# Patient Record
Sex: Female | Born: 1965 | Hispanic: Yes | Marital: Married | Smoking: Never smoker
Health system: Southern US, Community
[De-identification: ages and names within clinical notes are randomized; demographics above are authoritative.]

## PROBLEM LIST (undated history)

## (undated) DIAGNOSIS — E119 Type 2 diabetes mellitus without complications: Secondary | ICD-10-CM

## (undated) DIAGNOSIS — E7800 Pure hypercholesterolemia, unspecified: Secondary | ICD-10-CM

---

## 2006-06-11 ENCOUNTER — Ambulatory Visit: Payer: Self-pay | Admitting: Family Medicine

## 2007-03-12 ENCOUNTER — Emergency Department: Payer: Self-pay | Admitting: Internal Medicine

## 2010-06-24 ENCOUNTER — Ambulatory Visit: Payer: Self-pay | Admitting: Family

## 2012-09-15 ENCOUNTER — Ambulatory Visit: Payer: Self-pay | Admitting: Internal Medicine

## 2015-05-09 ENCOUNTER — Other Ambulatory Visit: Payer: Self-pay | Admitting: Internal Medicine

## 2015-05-09 DIAGNOSIS — Z1231 Encounter for screening mammogram for malignant neoplasm of breast: Secondary | ICD-10-CM

## 2015-05-16 ENCOUNTER — Ambulatory Visit: Payer: Self-pay | Attending: Internal Medicine

## 2016-06-09 ENCOUNTER — Ambulatory Visit: Payer: Self-pay | Attending: Internal Medicine

## 2016-11-17 ENCOUNTER — Other Ambulatory Visit: Payer: Self-pay | Admitting: Internal Medicine

## 2016-11-17 DIAGNOSIS — R1011 Right upper quadrant pain: Secondary | ICD-10-CM

## 2016-11-24 ENCOUNTER — Ambulatory Visit
Admission: RE | Admit: 2016-11-24 | Discharge: 2016-11-24 | Disposition: A | Discharge status: Home / Self Care | Payer: BLUE CROSS/BLUE SHIELD | Source: Ambulatory Visit | Attending: Internal Medicine | Admitting: Internal Medicine

## 2016-11-24 DIAGNOSIS — R1011 Right upper quadrant pain: Secondary | ICD-10-CM | POA: Diagnosis not present

## 2016-11-24 DIAGNOSIS — R935 Abnormal findings on diagnostic imaging of other abdominal regions, including retroperitoneum: Secondary | ICD-10-CM | POA: Diagnosis not present

## 2017-06-14 ENCOUNTER — Other Ambulatory Visit: Payer: Self-pay | Admitting: Internal Medicine

## 2017-06-14 DIAGNOSIS — Z1231 Encounter for screening mammogram for malignant neoplasm of breast: Secondary | ICD-10-CM

## 2017-06-28 ENCOUNTER — Encounter: Payer: Self-pay | Admitting: Radiology

## 2017-06-28 ENCOUNTER — Ambulatory Visit
Admission: RE | Admit: 2017-06-28 | Discharge: 2017-06-28 | Disposition: A | Discharge status: Home / Self Care | Payer: Managed Care, Other (non HMO) | Source: Ambulatory Visit | Attending: Internal Medicine | Admitting: Internal Medicine

## 2017-06-28 DIAGNOSIS — Z1231 Encounter for screening mammogram for malignant neoplasm of breast: Secondary | ICD-10-CM

## 2018-04-01 ENCOUNTER — Encounter: Payer: Self-pay | Admitting: Internal Medicine

## 2018-04-19 ENCOUNTER — Encounter: Payer: Self-pay | Admitting: *Deleted

## 2018-10-05 ENCOUNTER — Other Ambulatory Visit: Payer: Self-pay | Admitting: Nurse Practitioner

## 2018-10-05 DIAGNOSIS — Z1231 Encounter for screening mammogram for malignant neoplasm of breast: Secondary | ICD-10-CM

## 2019-03-11 ENCOUNTER — Other Ambulatory Visit: Payer: Self-pay | Admitting: Family Medicine

## 2019-03-11 DIAGNOSIS — Z20822 Contact with and (suspected) exposure to covid-19: Secondary | ICD-10-CM

## 2019-03-15 LAB — NOVEL CORONAVIRUS, NAA: SARS-CoV-2, NAA: NOT DETECTED

## 2020-09-24 ENCOUNTER — Other Ambulatory Visit: Payer: Self-pay

## 2020-09-24 ENCOUNTER — Other Ambulatory Visit: Payer: Managed Care, Other (non HMO)

## 2020-09-24 DIAGNOSIS — Z20822 Contact with and (suspected) exposure to covid-19: Secondary | ICD-10-CM

## 2020-09-27 LAB — NOVEL CORONAVIRUS, NAA: SARS-CoV-2, NAA: DETECTED — AB

## 2020-12-20 ENCOUNTER — Other Ambulatory Visit: Payer: Self-pay | Admitting: Internal Medicine

## 2020-12-20 DIAGNOSIS — Z1231 Encounter for screening mammogram for malignant neoplasm of breast: Secondary | ICD-10-CM

## 2021-09-03 ENCOUNTER — Other Ambulatory Visit: Payer: Self-pay | Admitting: Nurse Practitioner

## 2021-09-03 DIAGNOSIS — Z1231 Encounter for screening mammogram for malignant neoplasm of breast: Secondary | ICD-10-CM

## 2021-09-16 ENCOUNTER — Other Ambulatory Visit: Payer: Self-pay

## 2021-09-16 ENCOUNTER — Ambulatory Visit
Admission: RE | Admit: 2021-09-16 | Discharge: 2021-09-16 | Disposition: A | Discharge status: Home / Self Care | Payer: BC Managed Care – PPO | Source: Ambulatory Visit | Attending: Nurse Practitioner | Admitting: Nurse Practitioner

## 2021-09-16 DIAGNOSIS — Z1231 Encounter for screening mammogram for malignant neoplasm of breast: Secondary | ICD-10-CM | POA: Insufficient documentation

## 2022-08-25 ENCOUNTER — Other Ambulatory Visit: Payer: Self-pay | Admitting: Family Medicine

## 2022-08-25 DIAGNOSIS — Z1231 Encounter for screening mammogram for malignant neoplasm of breast: Secondary | ICD-10-CM

## 2022-10-03 ENCOUNTER — Ambulatory Visit
Admission: EM | Admit: 2022-10-03 | Discharge: 2022-10-03 | Disposition: A | Discharge status: Home / Self Care | Payer: BC Managed Care – PPO | Attending: Urgent Care | Admitting: Urgent Care

## 2022-10-03 DIAGNOSIS — I950 Idiopathic hypotension: Secondary | ICD-10-CM | POA: Diagnosis not present

## 2022-10-03 DIAGNOSIS — Z1152 Encounter for screening for COVID-19: Secondary | ICD-10-CM | POA: Insufficient documentation

## 2022-10-03 DIAGNOSIS — N3001 Acute cystitis with hematuria: Secondary | ICD-10-CM | POA: Diagnosis not present

## 2022-10-03 DIAGNOSIS — R1115 Cyclical vomiting syndrome unrelated to migraine: Secondary | ICD-10-CM | POA: Insufficient documentation

## 2022-10-03 DIAGNOSIS — R531 Weakness: Secondary | ICD-10-CM | POA: Diagnosis not present

## 2022-10-03 DIAGNOSIS — R6889 Other general symptoms and signs: Secondary | ICD-10-CM | POA: Diagnosis present

## 2022-10-03 DIAGNOSIS — W19XXXA Unspecified fall, initial encounter: Secondary | ICD-10-CM | POA: Diagnosis not present

## 2022-10-03 DIAGNOSIS — R058 Other specified cough: Secondary | ICD-10-CM | POA: Diagnosis not present

## 2022-10-03 LAB — POCT URINALYSIS DIP (MANUAL ENTRY)
Bilirubin, UA: NEGATIVE
Glucose, UA: NEGATIVE mg/dL
Ketones, POC UA: NEGATIVE mg/dL
Nitrite, UA: POSITIVE — AB
Protein Ur, POC: 100 mg/dL — AB
Spec Grav, UA: 1.025 (ref 1.010–1.025)
Urobilinogen, UA: 0.2 E.U./dL
pH, UA: 6 (ref 5.0–8.0)

## 2022-10-03 LAB — POCT FASTING CBG KUC MANUAL ENTRY: POCT Glucose (KUC): 270 mg/dL — AB (ref 70–99)

## 2022-10-03 MED ORDER — OSELTAMIVIR PHOSPHATE 75 MG PO CAPS: 75.0000 mg | ORAL_CAPSULE | Freq: Two times a day (BID) | ORAL | 0 refills | Status: DC

## 2022-10-03 MED ORDER — ONDANSETRON 4 MG PO TBDP: 4.0000 mg | ORAL_TABLET | Freq: Three times a day (TID) | ORAL | 0 refills | Status: DC | PRN

## 2022-10-03 MED ORDER — ONDANSETRON 4 MG PO TBDP
4.0000 mg | ORAL_TABLET | Freq: Once | ORAL | Status: AC
  Administered 2022-10-03: 4 mg via ORAL

## 2022-10-03 MED ORDER — SODIUM CHLORIDE 0.9 % IV BOLUS
1000.0000 mL | Freq: Once | INTRAVENOUS | Status: AC
  Administered 2022-10-03: 1000 mL via INTRAVENOUS

## 2022-10-03 MED ORDER — NITROFURANTOIN MONOHYD MACRO 100 MG PO CAPS: 100.0000 mg | ORAL_CAPSULE | Freq: Two times a day (BID) | ORAL | 0 refills | Status: DC

## 2022-10-03 NOTE — Discharge Instructions (Addendum)
You have been diagnosed with a viral upper respiratory infection based on your symptoms and exam. Viral illnesses cannot be treated with antibiotics - they are self limiting - and you should find your symptoms resolving within a few days. Get plenty of rest and non-caffeinated fluids. Watch for signs of dehydration including reduced urine output and dark colored urine.  We have performed a respiratory swab testing for COVID. I have prescribed Tamiflu, antiviral therapy for influenza A, based on a presumptive diagnosis of influenza.  Someone will contact you after results of your swab are available with instructions to continue or stop this medication.   We recommend you use over-the-counter medications for symptom control including acetaminophen (Tylenol), ibuprofen (Advil/Motrin) or naproxen (Aleve) for throat pain, fever, chills or body aches. You may combine use of acetaminophen and ibuprofen/naproxen if needed.  Some patients find an pain-relieving throat spray such as Chloraseptic to be effective.  Also recommend cold/cough medication containing a cough suppressant such as dextromethorphan, as needed. Please note that some cough medications are not recommended if you suffer from hypertension.    Saline mist spray is helpful for removing excess mucus from your nose.  Room humidifiers are helpful to ease breathing at night. I recommend guaifenesin (Mucinex) with plenty of water throughout the day to help thin and loosen mucus secretions in your respiratory passages.   If appropriate based upon your other medical problems, you might also find relief of nasal/sinus congestion symptoms by using a nasal decongestant such as fluticasone (Flonase ) or pseudoephedrine (Sudafed sinus).  You will need to obtain Sudafed from behind the pharmacist counter.  Speak to the pharmacist to verify that you are not duplicating medications with other over-the-counter formulations that you may be using.

## 2022-10-03 NOTE — ED Triage Notes (Signed)
Pt. Presents to UC w/ c/o emesis that started this morning after taking her Rybelsus medication. Pt's children also states the patient has been weak and she was found down after a fall and possibly hit the back of her head.

## 2022-10-03 NOTE — ED Provider Notes (Signed)
Roderic Palau    CSN: [id number] Arrival date & time: 10/03/22  0841      History   Chief Complaint Chief Complaint  Patient presents with   Emesis    HPI Renessa Lahti is a 57 y.o. female.    Emesis   Patient presents to urgent care with complaint of multiple episodes of vomiting starting this morning after taking Rybelsus.  Patient is here with her children, son, who states the patient has been "weak" and she was found down on the floor after a fall.  They are concerned she may have hit the back of her head. She denies bleeding but has a tender spot.  She endorses fever, chills, body aches, productive cough starting Thursday night.  Blood pressure of 93/59 measured in clinic with elevated heart rate of 136 bpm.  History reviewed. No pertinent past medical history.  There are no problems to display for this patient.   History reviewed. No pertinent surgical history.  OB History   No obstetric history on file.      Home Medications    Prior to Admission medications   Not on File    Family History History reviewed. No pertinent family history.  Social History Social History   Tobacco Use   Smoking status: Unknown     Allergies   Patient has no known allergies.   Review of Systems Review of Systems  Gastrointestinal:  Positive for vomiting.     Physical Exam Triage Vital Signs ED Triage Vitals  Enc Vitals Group     BP 10/03/22 0902 (!) 93/59     Pulse Rate 10/03/22 0902 (!) 136     Resp 10/03/22 0902 17     Temp 10/03/22 0902 (!) 96.6 F (35.9 C)     Temp src --      SpO2 10/03/22 0902 95 %     Weight --      Height --      Head Circumference --      Peak Flow --      Pain Score 10/03/22 0905 0     Pain Loc --      Pain Edu? --      Excl. in [place]? --    No data found.  Updated Vital Signs BP (!) 93/59   Pulse (!) 136   Temp (!) 96.6 F (35.9 C)   Resp 17   SpO2 95%   Visual Acuity Right Eye Distance:   Left  Eye Distance:   Bilateral Distance:    Right Eye Near:   Left Eye Near:    Bilateral Near:     Physical Exam Vitals reviewed.  Constitutional:      Appearance: She is ill-appearing and diaphoretic.  Cardiovascular:     Rate and Rhythm: Regular rhythm. Tachycardia present.     Heart sounds: Normal heart sounds.  Pulmonary:     Effort: Pulmonary effort is normal.     Breath sounds: Normal breath sounds.  Skin:    General: Skin is warm.  Neurological:     General: No focal deficit present.     Mental Status: She is alert and oriented to person, place, and time.  Psychiatric:        Mood and Affect: Mood normal.        Behavior: Behavior normal.      UC Treatments / Results  Labs (all labs ordered are listed, but only abnormal results are displayed) Labs Reviewed  POCT  FASTING CBG [facility] MANUAL ENTRY - Abnormal; Notable for the following components:      Result Value   POCT Glucose ([facility]) 270 (*)    All other components within normal limits  POCT URINALYSIS DIP (MANUAL ENTRY)    EKG   Radiology No results found.  Procedures Procedures (including critical care time)  Medications Ordered in UC Medications - No data to display  Initial Impression / Assessment and Plan / UC Course  I have reviewed the triage vital signs and the nursing notes.  Pertinent labs & imaging results that were available during my care of the patient were reviewed by me and considered in my medical decision making (see chart for details).   IV inserted and administering 1 L normal saline.  Ondansetron 4 mg ODT administered for nausea.  She endorses improved symptoms after ondansetron.  Patient is afebrile here without recent antipyretics. Satting well on room air. Overall is ill appearing, diaphoretic, without respiratory distress. Pulmonary exam is unremarkable.  Lungs CTAB without wheezing, rhonchi, rales.  UA is positive for signs of UTI including small 1+ leukocytes, nitrite positive,  moderate blood.  Urine is cloudy.  Will treat with Macrobid.  Patient's symptoms are consistent with an acute viral process including influenza and COVID.  COVID swab was obtained however treating empirically for influenza given she is within the treatment window for antiviral therapy.  Will switch to Paxlovid if COVID-positive.  Will also discharge with ondansetron ODT since it was effective in clinic.  Patient discharged after completion of normal saline infusion with improved blood pressure and heart rate.  Final Clinical Impressions(s) / UC Diagnoses   Final diagnoses:  None   Discharge Instructions   None    ED Prescriptions   None    PDMP not reviewed this encounter.   Rose Phi, [place] 10/03/22 1011

## 2022-10-04 LAB — SARS CORONAVIRUS 2 (TAT 6-24 HRS): SARS Coronavirus 2: NEGATIVE

## 2022-10-11 IMAGING — MG MM DIGITAL SCREENING BILAT W/ TOMO AND CAD
6 of 10 series · 6 of 30 positions shown · non-contrast
Comparison: Previous exam(s).

CLINICAL DATA: Screening.

EXAM:
DIGITAL SCREENING BILATERAL MAMMOGRAM WITH TOMOSYNTHESIS AND CAD
TECHNIQUE: Bilateral screening digital craniocaudal and mediolateral oblique
mammograms were obtained. Bilateral screening digital breast
tomosynthesis was performed. The images were evaluated with
computer-aided detection.

[L CC synth-2D (1 of 2)]
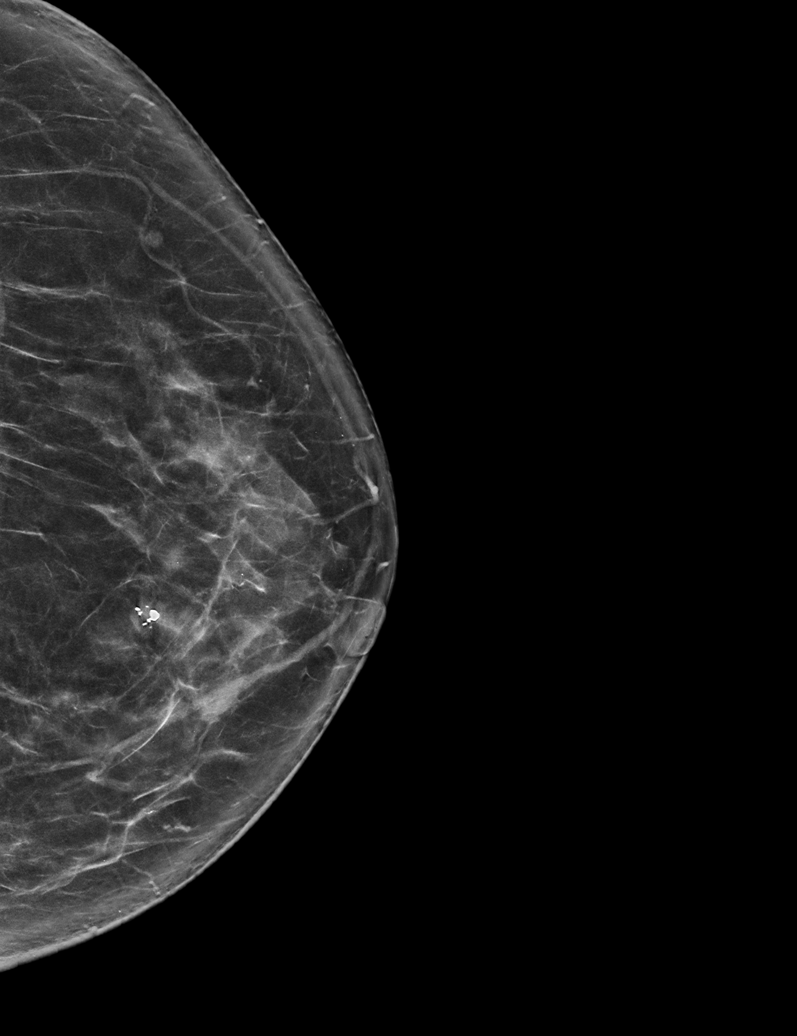

[L CC synth-2D (2 of 2)]
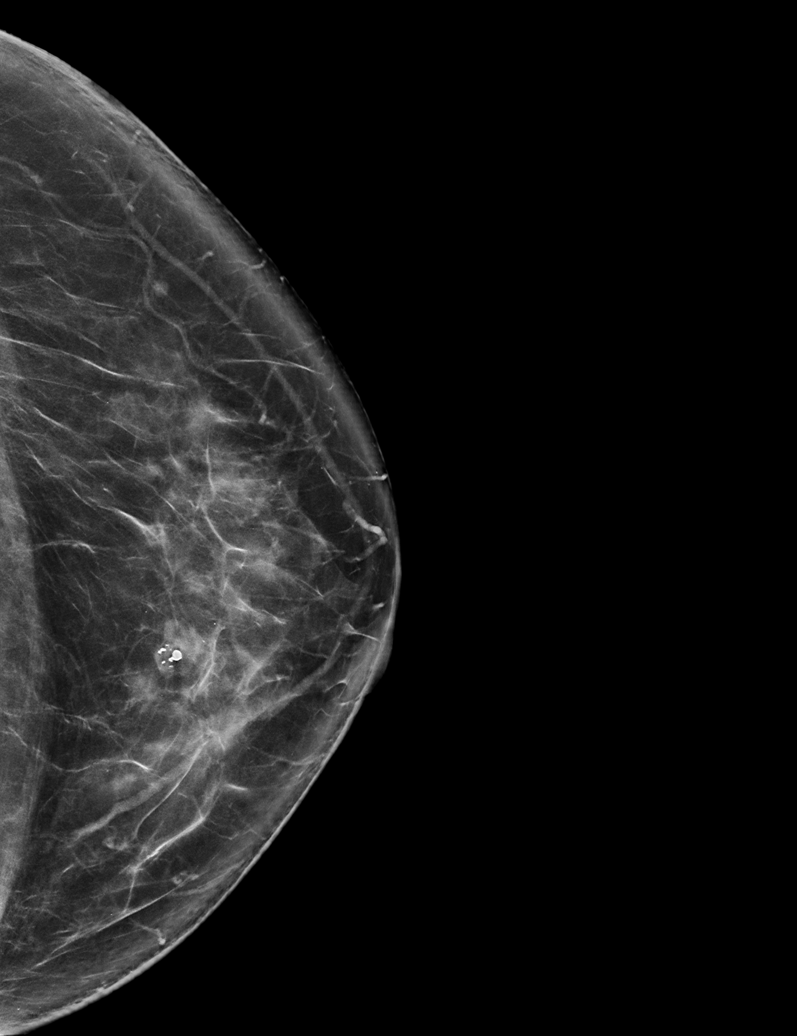

[L MLO synth-2D]
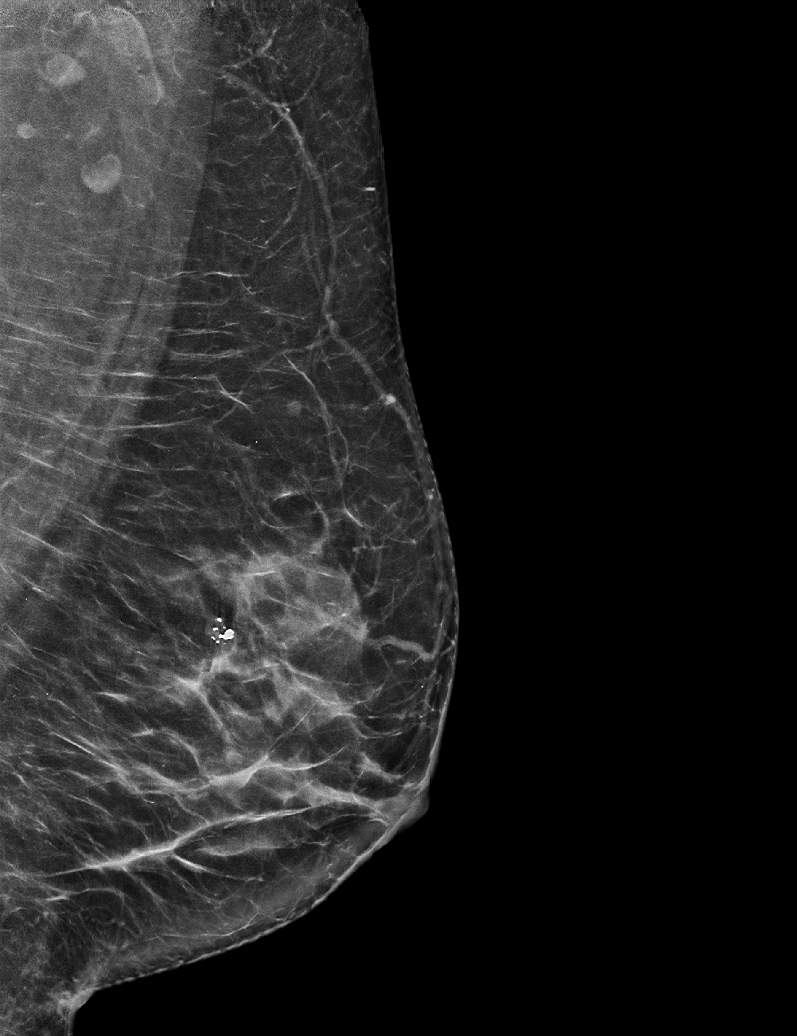

[R CC synth-2D]
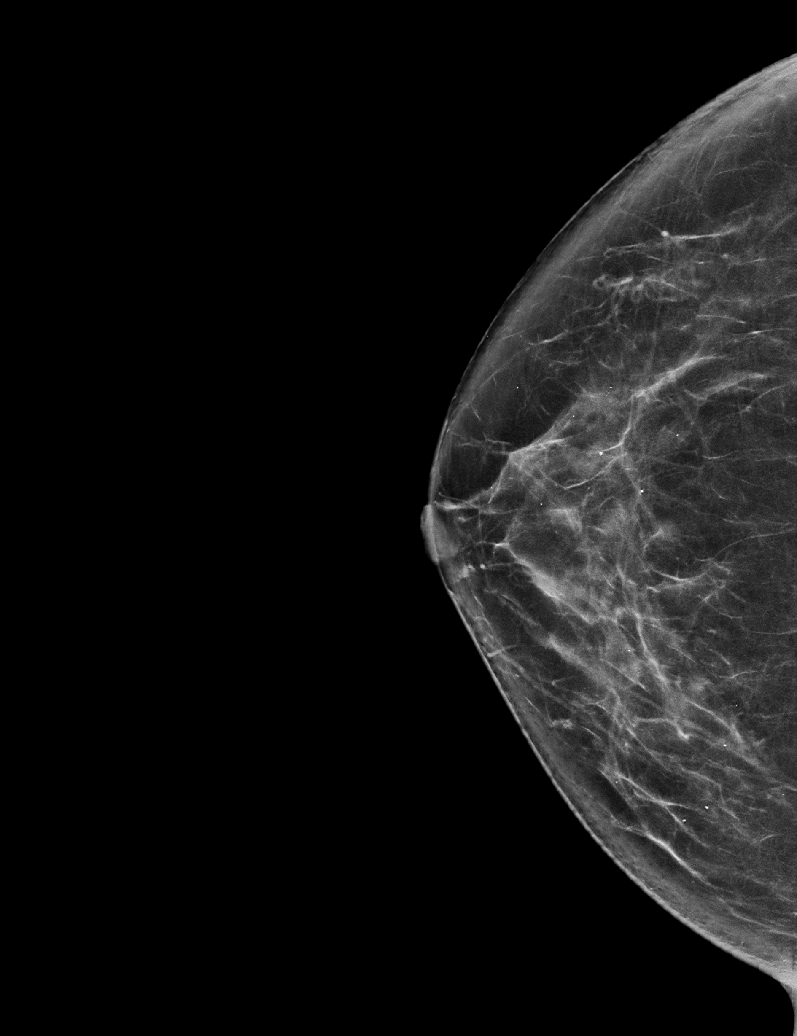

[R MLO synth-2D]
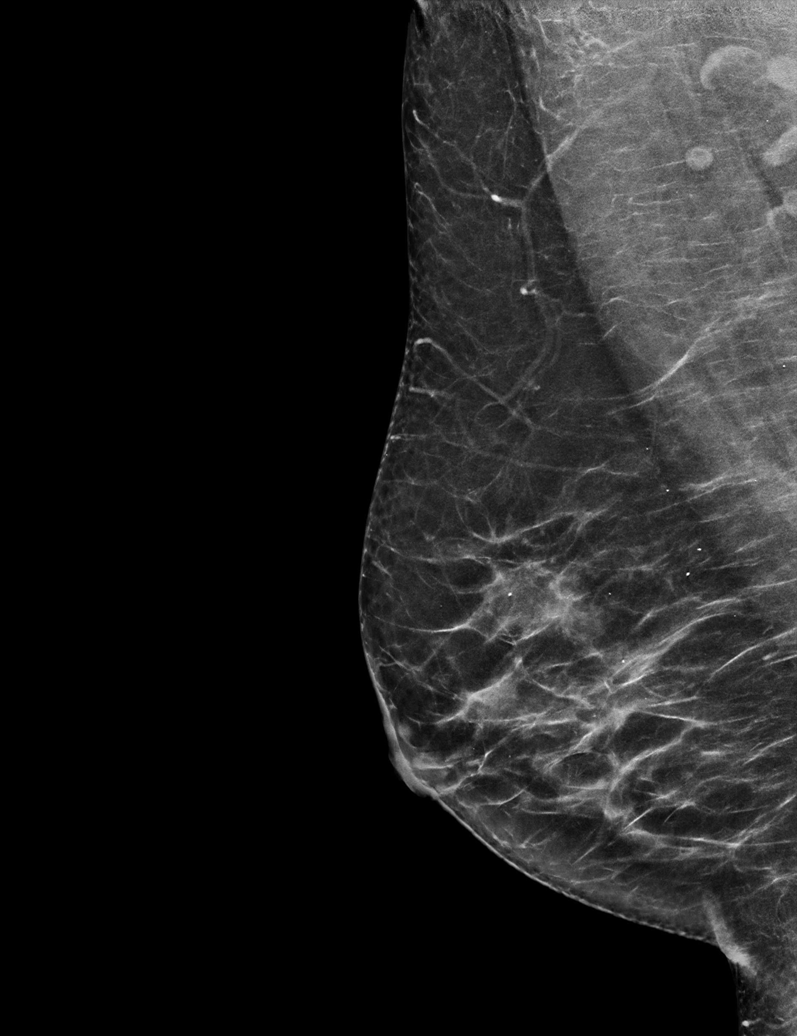

[R MLO tomo · tomo slice 33/64.0]
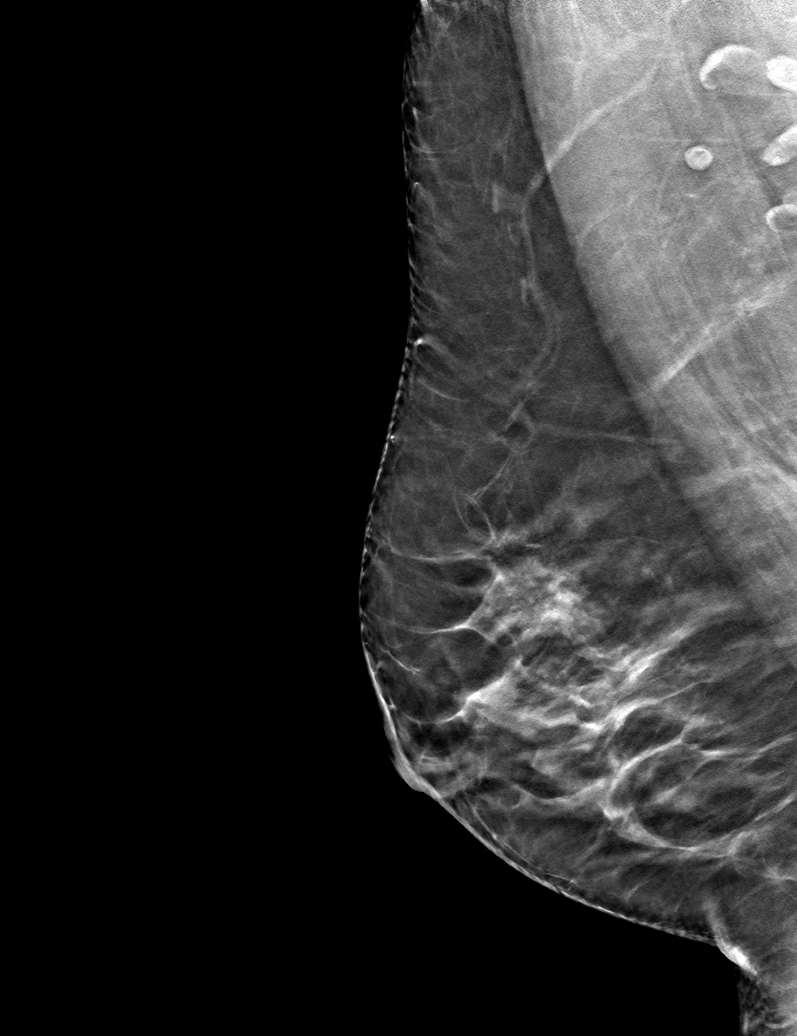

[6 of 30 positions shown; findings below may reference images not displayed]

ACR Breast Density Category b: There are scattered areas of
fibroglandular density.
FINDINGS: There are no findings suspicious for malignancy.
IMPRESSION: No mammographic evidence of malignancy. A result letter of this
screening mammogram will be mailed directly to the patient.

RECOMMENDATION:
Screening mammogram in one year. (Code:51-O-LD2)

BI-RADS CATEGORY  1: Negative.

## 2022-11-23 ENCOUNTER — Encounter: Payer: Self-pay | Admitting: Family Medicine

## 2022-11-30 ENCOUNTER — Encounter: Payer: Self-pay | Admitting: *Deleted

## 2023-06-09 ENCOUNTER — Telehealth: Payer: Self-pay

## 2023-06-09 NOTE — Telephone Encounter (Signed)
Patient called in to schedule colonoscopy.

## 2023-06-10 ENCOUNTER — Other Ambulatory Visit: Payer: Self-pay | Admitting: *Deleted

## 2023-06-10 ENCOUNTER — Telehealth: Payer: Self-pay | Admitting: *Deleted

## 2023-06-10 DIAGNOSIS — Z1211 Encounter for screening for malignant neoplasm of colon: Secondary | ICD-10-CM

## 2023-06-10 MED ORDER — PEG 3350-KCL-NABCB-NACL-NASULF 236 G PO SOLR: 4000.0000 mL | Freq: Once | ORAL | 0 refills | Status: AC

## 2023-06-10 NOTE — Telephone Encounter (Signed)
Colonoscopy schedule on 07/19/2023

## 2023-06-10 NOTE — Telephone Encounter (Signed)
Patient called back to schedule her colonoscopy

## 2023-06-10 NOTE — Telephone Encounter (Signed)
Message left for patient to return my call this morning because the connected was bad. I have left another message this afternoon.

## 2023-06-10 NOTE — Telephone Encounter (Signed)
Gastroenterology Pre-Procedure Review  Request Date: 07/19/2023 Requesting Physician: Dr. Tobi Bastos  PATIENT REVIEW QUESTIONS: The patient responded to the following health history questions as indicated:    1. Are you having any GI issues? no 2. Do you have a personal history of Polyps? no 3. Do you have a family history of Colon Cancer or Polyps? no 4. Diabetes Mellitus? yes (taking Janumet and Rybelsus) 5. Joint replacements in the past 12 months?no 6. Major health problems in the past 3 months?no 7. Any artificial heart valves, MVP, or defibrillator?no    MEDICATIONS & ALLERGIES:    Patient reports the following regarding taking any anticoagulation/antiplatelet therapy:   Plavix, Coumadin, Eliquis, Xarelto, Lovenox, Pradaxa, Brilinta, or Effient? no Aspirin? no  Patient confirms/reports the following medications:  Current Outpatient Medications  Medication Sig Dispense Refill   polyethylene glycol (GOLYTELY) 236 g solution Take 4,000 mLs by mouth once for 1 dose. 4000 mL 0   JANUMET 50-1000 MG tablet Take 1 tablet by mouth 2 (two) times daily.     nitrofurantoin, macrocrystal-monohydrate, (MACROBID) 100 MG capsule Take 1 capsule (100 mg total) by mouth 2 (two) times daily. 10 capsule 0   ondansetron (ZOFRAN-ODT) 4 MG disintegrating tablet Take 1 tablet (4 mg total) by mouth every 8 (eight) hours as needed for nausea or vomiting. 20 tablet 0   oseltamivir (TAMIFLU) 75 MG capsule Take 1 capsule (75 mg total) by mouth every 12 (twelve) hours. 10 capsule 0   PREMARIN vaginal cream Place 1 g vaginally once a week.     RYBELSUS 14 MG TABS Take 1 tablet by mouth daily.     No current facility-administered medications for this visit.    Patient confirms/reports the following allergies:  No Known Allergies  No orders of the defined types were placed in this encounter.   AUTHORIZATION INFORMATION Primary Insurance: 1D#: Group #:  Secondary Insurance: 1D#: Group #:  SCHEDULE  INFORMATION: Date: 07/19/2023 Time: Location:  [facility]

## 2023-07-05 ENCOUNTER — Ambulatory Visit
Admission: RE | Admit: 2023-07-05 | Discharge: 2023-07-05 | Disposition: A | Discharge status: Home / Self Care | Payer: BC Managed Care – PPO | Source: Ambulatory Visit | Attending: Family Medicine | Admitting: Family Medicine

## 2023-07-05 DIAGNOSIS — Z1231 Encounter for screening mammogram for malignant neoplasm of breast: Secondary | ICD-10-CM

## 2023-07-12 NOTE — Progress Notes (Signed)
[employer] used for [employer] Sue Lush [phone])

## 2023-07-19 ENCOUNTER — Ambulatory Visit
Admission: RE | Admit: 2023-07-19 | Payer: BC Managed Care – PPO | Source: Home / Self Care | Admitting: Gastroenterology

## 2023-07-19 SURGERY — COLONOSCOPY WITH PROPOFOL
Anesthesia: General

## 2023-07-27 ENCOUNTER — Telehealth: Payer: Self-pay

## 2023-07-27 NOTE — Telephone Encounter (Signed)
Patient is calling to reschedule her colonoscopy that was cancel on 07/19/2023. She states she called 1 week ago and someone told her she would get a message to the schedulers and no one called her back. She would like a call back so she can get a new date

## 2023-07-28 ENCOUNTER — Other Ambulatory Visit: Payer: Self-pay | Admitting: *Deleted

## 2023-07-28 ENCOUNTER — Telehealth: Payer: Self-pay | Admitting: *Deleted

## 2023-07-28 DIAGNOSIS — Z1211 Encounter for screening for malignant neoplasm of colon: Secondary | ICD-10-CM

## 2023-07-28 MED ORDER — PEG 3350-KCL-NABCB-NACL-NASULF 236 G PO SOLR: 4000.0000 mL | Freq: Once | ORAL | 0 refills | Status: AC

## 2023-07-28 NOTE — Telephone Encounter (Signed)
Colonoscopy reschedule to 08/17/2023 with Dr Tobi Bastos

## 2023-07-28 NOTE — Telephone Encounter (Signed)
Spoken to patient with interpreter # [phone]  Requesting to reschedule to 08/17/2023  Patient already pick up prep solution.  Will send instructions in English and Spanish like the last time

## 2023-08-16 ENCOUNTER — Encounter: Payer: Self-pay | Admitting: Gastroenterology

## 2023-08-17 ENCOUNTER — Encounter: Payer: Self-pay | Admitting: Gastroenterology

## 2023-08-17 ENCOUNTER — Ambulatory Visit
Admission: RE | Admit: 2023-08-17 | Discharge: 2023-08-17 | Disposition: A | Discharge status: Home / Self Care | Payer: BC Managed Care – PPO | Attending: Gastroenterology | Admitting: Gastroenterology

## 2023-08-17 ENCOUNTER — Ambulatory Visit: Payer: BC Managed Care – PPO | Admitting: Anesthesiology

## 2023-08-17 ENCOUNTER — Encounter
Admission: RE | Disposition: A | Discharge status: Home / Self Care | Payer: Self-pay | Source: Home / Self Care | Attending: Gastroenterology

## 2023-08-17 ENCOUNTER — Other Ambulatory Visit: Payer: Self-pay

## 2023-08-17 DIAGNOSIS — Z1211 Encounter for screening for malignant neoplasm of colon: Secondary | ICD-10-CM | POA: Diagnosis not present

## 2023-08-17 DIAGNOSIS — Z7984 Long term (current) use of oral hypoglycemic drugs: Secondary | ICD-10-CM | POA: Insufficient documentation

## 2023-08-17 DIAGNOSIS — E119 Type 2 diabetes mellitus without complications: Secondary | ICD-10-CM | POA: Diagnosis not present

## 2023-08-17 HISTORY — DX: Type 2 diabetes mellitus without complications: E11.9

## 2023-08-17 HISTORY — PX: COLONOSCOPY WITH PROPOFOL: SHX5780

## 2023-08-17 HISTORY — DX: Pure hypercholesterolemia, unspecified: E78.00

## 2023-08-17 LAB — GLUCOSE, CAPILLARY: Glucose-Capillary: 146 mg/dL — ABNORMAL HIGH (ref 70–99)

## 2023-08-17 SURGERY — COLONOSCOPY WITH PROPOFOL
Anesthesia: General

## 2023-08-17 MED ORDER — LIDOCAINE HCL (CARDIAC) PF 100 MG/5ML IV SOSY
PREFILLED_SYRINGE | INTRAVENOUS | Status: DC | PRN
  Administered 2023-08-17: 50 mg via INTRAVENOUS

## 2023-08-17 MED ORDER — PROPOFOL 10 MG/ML IV BOLUS
INTRAVENOUS | Status: DC | PRN
  Administered 2023-08-17: 100 mg via INTRAVENOUS
  Administered 2023-08-17: 150 ug/kg/min via INTRAVENOUS

## 2023-08-17 MED ORDER — SODIUM CHLORIDE 0.9 % IV SOLN: INTRAVENOUS | Status: DC

## 2023-08-17 NOTE — Anesthesia Postprocedure Evaluation (Signed)
Anesthesia Post Note  Patient: Melody Garcia  Procedure(s) Performed: COLONOSCOPY WITH PROPOFOL  Patient location during evaluation: PACU Anesthesia Type: General Level of consciousness: awake and alert, oriented and patient cooperative Pain management: pain level controlled Vital Signs Assessment: post-procedure vital signs reviewed and stable Respiratory status: spontaneous breathing, nonlabored ventilation and respiratory function stable Cardiovascular status: blood pressure returned to baseline and stable Postop Assessment: adequate PO intake Anesthetic complications: no   No notable events documented.   Last Vitals:  Vitals:   08/17/23 1129 08/17/23 1139  BP: 114/71 127/70  Pulse: (!) 59 (!) 56  Resp: 16   Temp:    SpO2: 97% 100%    Last Pain:  Vitals:   08/17/23 1129  TempSrc:   PainSc: 0-No pain                 Reed Breech

## 2023-08-17 NOTE — Anesthesia Preprocedure Evaluation (Signed)
Anesthesia Evaluation  Patient identified by MRN, date of birth, ID band Patient awake    Reviewed: Allergy & Precautions, NPO status , Patient's Chart, lab work & pertinent test results  History of Anesthesia Complications Negative for: history of anesthetic complications  Airway Mallampati: IV   Neck ROM: Full    Dental no notable dental hx.    Pulmonary neg pulmonary ROS   Pulmonary exam normal breath sounds clear to auscultation       Cardiovascular Exercise Tolerance: Good negative cardio ROS Normal cardiovascular exam Rhythm:Regular Rate:Normal     Neuro/Psych negative neurological ROS     GI/Hepatic negative GI ROS,,,  Endo/Other  diabetes, Type 2    Renal/GU negative Renal ROS     Musculoskeletal   Abdominal   Peds  Hematology negative hematology ROS (+)   Anesthesia Other Findings   Reproductive/Obstetrics                             Anesthesia Physical Anesthesia Plan  ASA: 2  Anesthesia Plan: General   Post-op Pain Management:    Induction: Intravenous  PONV Risk Score and Plan: 3 and Propofol infusion, TIVA and Treatment may vary due to age or medical condition  Airway Management Planned: Natural Airway  Additional Equipment:   Intra-op Plan:   Post-operative Plan:   Informed Consent: I have reviewed the patients History and Physical, chart, labs and discussed the procedure including the risks, benefits and alternatives for the proposed anesthesia with the patient or authorized representative who has indicated his/her understanding and acceptance.       Plan Discussed with: CRNA  Anesthesia Plan Comments: (LMA/GETA backup discussed.  Patient consented for risks of anesthesia including but not limited to:  - adverse reactions to medications - damage to eyes, teeth, lips or other oral mucosa - nerve damage due to positioning  - sore throat or  hoarseness - damage to heart, brain, nerves, lungs, other parts of body or loss of life  Informed patient about role of CRNA in peri- and intra-operative care.  Patient voiced understanding.)       Anesthesia Quick Evaluation

## 2023-08-17 NOTE — Anesthesia Procedure Notes (Signed)
Date/Time: 08/17/2023 11:01 AM  Performed by: Ginger Carne, CRNAPre-anesthesia Checklist: Patient identified, Emergency Drugs available, Suction available, Patient being monitored and Timeout performed Patient Re-evaluated:Patient Re-evaluated prior to induction Oxygen Delivery Method: Nasal cannula Preoxygenation: Pre-oxygenation with 100% oxygen Induction Type: IV induction

## 2023-08-17 NOTE — Transfer of Care (Signed)
Immediate Anesthesia Transfer of Care Note  Patient: Melody Garcia  Procedure(s) Performed: COLONOSCOPY WITH PROPOFOL  Patient Location: Endoscopy Unit  Anesthesia Type:General  Level of Consciousness: awake, alert , and drowsy  Airway & Oxygen Therapy: Patient Spontanous Breathing  Post-op Assessment: Report given to RN and Post -op Vital signs reviewed and stable  Post vital signs: Reviewed and stable  Last Vitals:  Vitals Value Taken Time  BP 101/60 08/17/23 1119  Temp    Pulse 62 08/17/23 1119  Resp 13 08/17/23 1119  SpO2 96 % 08/17/23 1119    Last Pain:  Vitals:   08/17/23 1119  TempSrc:   PainSc: Asleep         Complications: No notable events documented.

## 2023-08-17 NOTE — H&P (Signed)
     Wyline Mood, MD [address], [place], [place], [address] [address], [address], [place], [place], [address] Phone: [phone]  Fax: [phone]  Primary Care Physician:  [facility]   Pre-Procedure History & Physical: HPI:  Melody Garcia is a 57 y.o. female is here for an colonoscopy.   History reviewed. No pertinent past medical history.  History reviewed. No pertinent surgical history.  Prior to Admission medications   Medication Sig Start Date End Date Taking? Authorizing Provider  JANUMET 50-1000 MG tablet Take 1 tablet by mouth 2 (two) times daily. 04/07/23   [provider]  nitrofurantoin, macrocrystal-monohydrate, (MACROBID) 100 MG capsule Take 1 capsule (100 mg total) by mouth 2 (two) times daily. 10/03/22   Immordino, Jeannett Senior, FNP  ondansetron (ZOFRAN-ODT) 4 MG disintegrating tablet Take 1 tablet (4 mg total) by mouth every 8 (eight) hours as needed for nausea or vomiting. 10/03/22   Immordino, Jeannett Senior, FNP  oseltamivir (TAMIFLU) 75 MG capsule Take 1 capsule (75 mg total) by mouth every 12 (twelve) hours. 10/03/22   Immordino, Jeannett Senior, FNP  PREMARIN vaginal cream Place 1 g vaginally once a week. 04/07/23   [provider]  RYBELSUS 14 MG TABS Take 1 tablet by mouth daily. 01/26/23   [provider]    Allergies as of 07/28/2023   (No Known Allergies)    History reviewed. No pertinent family history.  Social History   Socioeconomic History   Marital status: Unknown    Spouse name: Not on file   Number of children: Not on file   Years of education: Not on file   Highest education level: Not on file  Occupational History   Not on file  Tobacco Use   Smoking status: Unknown   Smokeless tobacco: Not on file  Substance and Sexual Activity   Alcohol use: Not on file   Drug use: Not on file   Sexual activity: Not on file  Other Topics Concern   Not on file  Social History Narrative   **  Merged History Encounter **       ** Merged History Encounter **       Social Determinants of Health   Financial Resource Strain: Not on file  Food Insecurity: Not on file  Transportation Needs: Not on file  Physical Activity: Not on file  Stress: Not on file  Social Connections: Not on file  Intimate Partner Violence: Not on file    Review of Systems: See HPI, otherwise negative ROS  Physical Exam: There were no vitals taken for this visit. General:   Alert,  pleasant and cooperative in NAD Head:  Normocephalic and atraumatic. Neck:  Supple; no masses or thyromegaly. Lungs:  Clear throughout to auscultation, normal respiratory effort.    Heart:  +S1, +S2, Regular rate and rhythm, No edema. Abdomen:  Soft, nontender and nondistended. Normal bowel sounds, without guarding, and without rebound.   Neurologic:  Alert and  oriented x4;  grossly normal neurologically.  Impression/Plan: Melody Garcia is here for an colonoscopy to be performed for Screening colonoscopy average risk   Risks, benefits, limitations, and alternatives regarding  colonoscopy have been reviewed with the patient.  Questions have been answered.  All parties agreeable.   Wyline Mood, MD  08/17/2023, 10:09 AM

## 2023-08-17 NOTE — Op Note (Signed)
[facility] Gastroenterology Patient Name: Melody Garcia Procedure Date: 08/17/2023 10:58 AM MRN: [id number] Account #: [id number] Date of Birth: 15-Oct-1965 Admit Type: Outpatient Age: 57 Room: [facility] ENDO ROOM 2 Gender: Female Note Status: Finalized Instrument Name: Prentice Docker [id number] Procedure:             Colonoscopy Indications:           Screening for colorectal malignant neoplasm Providers:             Wyline Mood MD, MD Referring MD:          No Local Md, MD (Referring MD) Medicines:             Monitored Anesthesia Care Complications:         No immediate complications. Procedure:             Pre-Anesthesia Assessment:                        - Prior to the procedure, a History and Physical was                         performed, and patient medications, allergies and                         sensitivities were reviewed. The patient's tolerance                         of previous anesthesia was reviewed.                        - The risks and benefits of the procedure and the                         sedation options and risks were discussed with the                         patient. All questions were answered and informed                         consent was obtained.                        - ASA Grade Assessment: II - A patient with mild                         systemic disease.                        After obtaining informed consent, the colonoscope was                         passed under direct vision. Throughout the procedure,                         the patient's blood pressure, pulse, and oxygen                         saturations were monitored continuously. The                         Colonoscope was  introduced through the anus and                         advanced to the the cecum, identified by the                         appendiceal orifice. The colonoscopy was performed                         with ease. The patient tolerated the procedure  well.                         The quality of the bowel preparation was excellent.                         The ileocecal valve, appendiceal orifice, and rectum                         were photographed. Findings:      The entire examined colon appeared normal on direct and retroflexion       views. Impression:            - The entire examined colon is normal on direct and                         retroflexion views.                        - No specimens collected. Recommendation:        - Discharge patient to home (with escort).                        - Resume previous diet.                        - Continue present medications.                        - Repeat colonoscopy in 10 years for screening                         purposes. Procedure Code(s):     --- Professional ---                        [phone], Colonoscopy, flexible; diagnostic, including                         collection of specimen(s) by brushing or washing, when                         performed (separate procedure) Diagnosis Code(s):     --- Professional ---                        Z12.11, Encounter for screening for malignant neoplasm                         of colon CPT copyright 2022 American Medical Association. All rights reserved. The codes documented in this report are preliminary and upon coder review may  be revised to meet  current compliance requirements. Wyline Mood, MD Wyline Mood MD, MD 08/17/2023 11:15:38 AM This report has been signed electronically. Number of Addenda: 0 Note Initiated On: 08/17/2023 10:58 AM Scope Withdrawal Time: 0 hours 6 minutes 42 seconds  Total Procedure Duration: 0 hours 9 minutes 53 seconds  Estimated Blood Loss:  Estimated blood loss: none.      [facility]

## 2023-08-18 ENCOUNTER — Encounter: Payer: Self-pay | Admitting: Gastroenterology

## 2024-08-24 ENCOUNTER — Other Ambulatory Visit: Payer: Self-pay | Admitting: Family Medicine

## 2024-08-24 DIAGNOSIS — Z1231 Encounter for screening mammogram for malignant neoplasm of breast: Secondary | ICD-10-CM

## 2024-10-01 ENCOUNTER — Ambulatory Visit: Admission: EM | Admit: 2024-10-01 | Discharge: 2024-10-01 | Disposition: A | Discharge status: Home / Self Care

## 2024-10-01 DIAGNOSIS — J209 Acute bronchitis, unspecified: Secondary | ICD-10-CM

## 2024-10-01 MED ORDER — ALBUTEROL SULFATE HFA 108 (90 BASE) MCG/ACT IN AERS: 2.0000 | INHALATION_SPRAY | RESPIRATORY_TRACT | 0 refills | Status: AC | PRN

## 2024-10-01 MED ORDER — CEFDINIR 300 MG PO CAPS: 300.0000 mg | ORAL_CAPSULE | Freq: Two times a day (BID) | ORAL | 0 refills | Status: AC

## 2024-10-01 MED ORDER — BENZONATATE 100 MG PO CAPS: 100.0000 mg | ORAL_CAPSULE | Freq: Three times a day (TID) | ORAL | 0 refills | Status: AC

## 2024-10-01 MED ORDER — PROMETHAZINE-DM 6.25-15 MG/5ML PO SYRP: 5.0000 mL | ORAL_SOLUTION | Freq: Every evening | ORAL | 0 refills | Status: AC | PRN

## 2024-10-01 NOTE — ED Triage Notes (Signed)
 Patient here today with c/o productive cough, fever, fatigue, nasal congestion, pain in her upper back and chest X 17 days. Patient has been taking Advil cold and sinus with no relief. Her grandchildren have also had a cold.

## 2024-10-01 NOTE — Discharge Instructions (Signed)
 Hoy lo evalan por su tos y congestin persistentes, en el examen puedo escuchar sibilancias que son profundas debido a la opresin de las vas respiratorias, conservator, museum/gallery respiracin y puede hacer que su tos empeore.    Hoy est siendo tratado por bronquitis, que es una afeccin inflamatoria de las vas respiratorias superiores.    Comience con cefdinir  dos veces al da durante 7 das para el tratamiento de bacterias. cada 4 horas segn sea necesario    Para la tos, puede usar la pastilla Tessalon  cada 8 horas y puede usar jarabe para la tos antes de acostarse para ayudarlo a dormir si es necesario    El dolor de pecho y de espalda que experimenta se debe a su tos persistente durante 2 semanas, la tos [employer], puede tomar Tylenol o Advil segn sea necesario para ayudar con el dolor, tambin puede sostener compresas tibias y masajear las reas     Para la tos: 1/2 a 1 cucharadita de miel (puede diluir la miel) en agua u otro fluido). Puedes usar un humidificador para la congestin del pecho.   Today you are evaluated for your persisting cough and congestion, on exam I am able to hear wheezing which is profound of airway tightness, this makes it difficult for you to breathe and can cause your cough to worsen  Today you are being treated for bronchitis which is an inflammatory condition of the upper airway  Begin cefdinir  twice daily for 7 days for treatment of bacteria  For shortness of breath and wheezing you may use albuterol  inhaler taking 2 puffs every 4 hours as needed  For coughing you may use Tessalon  pill every 8 hours and you may use cough syrup at bedtime to help you sleep if needed  Chest pain and back pain that you are experiencing is due to your persistent cough for 2 weeks, coughing is irritating to the muscles, you may take Tylenol or Advil as needed to help with pain you may also hold warm compresses and massage the areas   For cough: honey 1/2 to 1  teaspoon (you can dilute the honey in water or another fluid). You can use a humidifier for chest congestion and cough.  If you don't have a humidifier, you can sit in the bathroom with the hot shower running.      For sore throat: try warm salt water gargles, cepacol lozenges, throat spray, warm tea or water with lemon/honey, popsicles or ice, or OTC cold relief medicine for throat discomfort.   For congestion: take a daily anti-histamine like Zyrtec, Claritin, and a oral decongestant, such as pseudoephedrine.  You can also use Flonase 1-2 sprays in each nostril daily.   It is important to stay hydrated: drink plenty of fluids (water, gatorade/powerade/pedialyte, juices, or teas) to keep your throat moisturized and help further relieve irritation/discomfort.  If your symptoms do not improve despite treatment or at any point your breathing worsens please return for reevaluation

## 2024-10-01 NOTE — ED Provider Notes (Signed)
 " CAY RALPH PELT    CSN: [id number] Arrival date & time: 10/01/24  1140      History   Chief Complaint Chief Complaint  Patient presents with   Cough    HPI Melody Garcia is a 59 y.o. female.   Patient presents for evaluation of a fever peaking at 102, nasal congestion, rhinorrhea, sore throat, productive cough with green sputum, shortness of breath only when lying flat, intermittent wheezing and centralized chest pain and right-sided back pain beginning 17 days ago .  Grandkids sick prior to symptoms beginning.  Decreased appetite but tolerable to some food and liquids.  Has attempted use of Sudafed Advil and cough syrup.  Denies respiratory history, non-smoker.   Spanish interpreter used  Past Medical History:  Diagnosis Date   Diabetes mellitus without complication (HCC)    Hypercholesteremia     Patient Active Problem List   Diagnosis Date Noted   Colon cancer screening 08/17/2023    Past Surgical History:  Procedure Laterality Date   CESAREAN SECTION  1996   COLONOSCOPY WITH PROPOFOL  N/A 08/17/2023   Procedure: COLONOSCOPY WITH PROPOFOL ;  Surgeon: Therisa Bi, MD;  Location: [facility] ENDOSCOPY;  Service: Gastroenterology;  Laterality: N/A;  DM, Spanish Interpreter for pre-op, maybe - post-op for medical terminology    OB History   No obstetric history on file.      Home Medications    Prior to Admission medications  Medication Sig Start Date End Date Taking? Authorizing Provider  JANUMET 50-1000 MG tablet Take 1 tablet by mouth 2 (two) times daily. Patient not taking: Reported on 10/01/2024 04/07/23   [provider]  metFORMIN (GLUCOPHAGE) 1000 MG tablet Take 1,000 mg by mouth 2 (two) times daily.    [provider]  PREMARIN vaginal cream Place 1 g vaginally once a week. 04/07/23   [provider]  simvastatin (ZOCOR) 10 MG tablet Take 10 mg by mouth daily.    [provider]    Family History History reviewed. No  pertinent family history.  Social History Social History[1]   Allergies   Penicillins, Canagliflozin, Empagliflozin, and Pioglitazone   Review of Systems Review of Systems  Constitutional:  Positive for fever. Negative for activity change, appetite change, chills, diaphoresis, fatigue and unexpected weight change.  HENT:  Positive for congestion, rhinorrhea and sore throat. Negative for dental problem, drooling, ear discharge, ear pain, facial swelling, hearing loss, mouth sores, nosebleeds, postnasal drip, sinus pressure, sinus pain, sneezing, tinnitus, trouble swallowing and voice change.   Respiratory:  Positive for cough and shortness of breath. Negative for apnea, choking, chest tightness, wheezing and stridor.   Cardiovascular:  Positive for chest pain. Negative for palpitations and leg swelling.  Gastrointestinal: Negative.      Physical Exam Triage Vital Signs ED Triage Vitals  Encounter Vitals Group     BP 10/01/24 1153 137/63     Girls Systolic BP Percentile --      Girls Diastolic BP Percentile --      Boys Systolic BP Percentile --      Boys Diastolic BP Percentile --      Pulse Rate 10/01/24 1153 79     Resp 10/01/24 1153 16     Temp 10/01/24 1153 98.7 F (37.1 C)     Temp Source 10/01/24 1153 Oral     SpO2 10/01/24 1153 96 %     Weight --      Height --      Head Circumference --  Peak Flow --      Pain Score 10/01/24 1146 5     Pain Loc --      Pain Education --      Exclude from Growth Chart --    No data found.  Updated Vital Signs BP 137/63 (BP Location: Right Arm)   Pulse 79   Temp 98.7 F (37.1 C) (Oral)   Resp 16   SpO2 96%   Visual Acuity Right Eye Distance:   Left Eye Distance:   Bilateral Distance:    Right Eye Near:   Left Eye Near:    Bilateral Near:     Physical Exam Constitutional:      Appearance: Normal appearance.  HENT:     Right Ear: Tympanic membrane, ear canal and external ear normal.     Left Ear: Tympanic  membrane and external ear normal.     Nose: Congestion present.     Mouth/Throat:     Pharynx: No oropharyngeal exudate or posterior oropharyngeal erythema.  Eyes:     Extraocular Movements: Extraocular movements intact.  Cardiovascular:     Pulses: Normal pulses.     Heart sounds: Normal heart sounds.  Pulmonary:     Effort: Pulmonary effort is normal.     Breath sounds: Wheezing present.  Chest:     Comments: Tenderness throughout the chest wall, chest wall symmetrical Neurological:     Mental Status: She is alert and oriented to person, place, and time. Mental status is at baseline.      UC Treatments / Results  Labs (all labs ordered are listed, but only abnormal results are displayed) Labs Reviewed - No data to display  EKG   Radiology No results found.  Procedures Procedures (including critical care time)  Medications Ordered in UC Medications - No data to display  Initial Impression / Assessment and Plan / UC Course  I have reviewed the triage vital signs and the nursing notes.  Pertinent labs & imaging results that were available during my care of the patient were reviewed by me and considered in my medical decision making (see chart for details).  Acute bronchitis  Vitals are stable, O2 saturation 96% on room air, wheezing present on exam, patient in no signs of distress nontoxic-appearing but harsh cough witnessed concerning for bronchitis, imaging unavailable today, prescribed cefdinir , albuterol  inhaler, deferring use of prednisone due to history of diabetes, prescribed Tessalon  and Promethazine  DM, discussed administration of all medication, recommended over-the-counter medication and nonpharmacological measures to advise follow-up for persisting or worsening symptoms Final Clinical Impressions(s) / UC Diagnoses   Final diagnoses:  None   Discharge Instructions   None    ED Prescriptions   None    PDMP not reviewed this encounter.     [1]   Social History Tobacco Use   Smoking status: Never   Smokeless tobacco: Never  Substance Use Topics   Alcohol use: Not Currently   Drug use: Never     Teresa Shelba SAUNDERS, NP 10/01/24 1225  "
